# Patient Record
Sex: Female | Born: 1975 | Race: Black or African American | Hispanic: No | Marital: Single | Smoking: Former smoker
Health system: Southern US, Community
[De-identification: ages and names within clinical notes are randomized; demographics above are authoritative.]

## PROBLEM LIST (undated history)

## (undated) DIAGNOSIS — K219 Gastro-esophageal reflux disease without esophagitis: Secondary | ICD-10-CM

## (undated) DIAGNOSIS — Z889 Allergy status to unspecified drugs, medicaments and biological substances status: Secondary | ICD-10-CM

---

## 1997-09-06 ENCOUNTER — Emergency Department (HOSPITAL_COMMUNITY): Admission: EM | Admit: 1997-09-06 | Discharge: 1997-09-06 | Payer: Self-pay | Admitting: Emergency Medicine

## 2009-06-23 ENCOUNTER — Emergency Department (HOSPITAL_BASED_OUTPATIENT_CLINIC_OR_DEPARTMENT_OTHER): Admission: EM | Admit: 2009-06-23 | Discharge: 2009-06-23 | Payer: Self-pay | Admitting: Emergency Medicine

## 2009-08-17 ENCOUNTER — Emergency Department (HOSPITAL_BASED_OUTPATIENT_CLINIC_OR_DEPARTMENT_OTHER): Admission: EM | Admit: 2009-08-17 | Discharge: 2009-08-17 | Payer: Self-pay | Admitting: Emergency Medicine

## 2009-08-25 ENCOUNTER — Encounter: Admission: RE | Admit: 2009-08-25 | Discharge: 2009-11-03 | Payer: Self-pay | Admitting: Sports Medicine

## 2014-11-15 ENCOUNTER — Emergency Department (HOSPITAL_BASED_OUTPATIENT_CLINIC_OR_DEPARTMENT_OTHER)
Admission: EM | Admit: 2014-11-15 | Discharge: 2014-11-15 | Disposition: A | Discharge status: Home / Self Care | Payer: BLUE CROSS/BLUE SHIELD | Attending: Emergency Medicine | Admitting: Emergency Medicine

## 2014-11-15 ENCOUNTER — Encounter (HOSPITAL_BASED_OUTPATIENT_CLINIC_OR_DEPARTMENT_OTHER): Payer: Self-pay | Admitting: Emergency Medicine

## 2014-11-15 DIAGNOSIS — Z720 Tobacco use: Secondary | ICD-10-CM | POA: Insufficient documentation

## 2014-11-15 DIAGNOSIS — R51 Headache: Secondary | ICD-10-CM | POA: Diagnosis present

## 2014-11-15 DIAGNOSIS — Z79899 Other long term (current) drug therapy: Secondary | ICD-10-CM | POA: Insufficient documentation

## 2014-11-15 DIAGNOSIS — J302 Other seasonal allergic rhinitis: Secondary | ICD-10-CM | POA: Insufficient documentation

## 2014-11-15 DIAGNOSIS — J3089 Other allergic rhinitis: Secondary | ICD-10-CM

## 2014-11-15 HISTORY — DX: Allergy status to unspecified drugs, medicaments and biological substances: Z88.9

## 2014-11-15 MED ORDER — METHYLPREDNISOLONE 4 MG PO TBPK: ORAL_TABLET | ORAL | Status: AC

## 2014-11-15 NOTE — ED Notes (Addendum)
Patient reports seasonal allergies but states this morning "my eyes were closed shut".  Reports she took benadryl this morning and the swelling subsided but reports continued itching and watering of eyes.  Reports she was already taking claritin D for the past few days due to sinus issues.  Currently reports pain to upper cheek bones and around eye lids

## 2014-11-15 NOTE — ED Notes (Signed)
C/o itching watery eyes x 3 days  Was worse this am when she woke

## 2014-11-15 NOTE — ED Provider Notes (Signed)
CSN: [id number]     Arrival date & time 11/15/14  1952 History   First MD Initiated Contact with Patient 11/15/14 2005     Chief Complaint  Patient presents with  . Facial Pain     (Consider location/radiation/quality/duration/timing/severity/associated sxs/prior Treatment) HPI  Blood pressure 137/89, pulse 89, temperature 98.2 F (36.8 C), resp. rate 18, height 5\' 3"  (1.6 m), weight 160 lb (72.576 kg), SpO2 98 %.  Lisa Gibbs is a 39 y.o. female complaining of itching eyes, states that she woke when she woke up this morning her eyes were more swollen, she took a Benadryl and applied a cold pack with some relief however her eyes continue to itch. Patient is taking Claritin-D and states that this is not helping as much as she would like. She denies diabetes, fever, chills, purulent discharge, eyes crusting shut, pain with eye movement. She states that she feels a slight pressure along the bilateral cheekbones, she rates this is mild, no exacerbating or alleviating factors identified.  Past Medical History  Diagnosis Date  . Hx of seasonal allergies    History reviewed. No pertinent past surgical history. History reviewed. No pertinent family history. History  Substance Use Topics  . Smoking status: Current Some Day Smoker    Types: Cigarettes  . Smokeless tobacco: Not on file  . Alcohol Use: Yes     Comment: occassional   OB History    No data available     Review of Systems  10 systems reviewed and found to be negative, except as noted in the HPI.   Allergies  Review of patient's allergies indicates no known allergies.  Home Medications   Prior to Admission medications   Medication Sig Start Date End Date Taking? Authorizing Provider  loratadine-pseudoephedrine (CLARITIN-D 12-HOUR) 5-120 MG per tablet Take 1 tablet by mouth 2 (two) times daily.   Yes Historical Provider, MD  omeprazole (PRILOSEC) 20 MG capsule Take 20 mg by mouth daily.   Yes Historical Provider, MD    BP 137/89 mmHg  Pulse 89  Temp(Src) 98.2 F (36.8 C)  Resp 18  Ht 5\' 3"  (1.6 m)  Wt 160 lb (72.576 kg)  BMI 28.35 kg/m2  SpO2 98%  LMP  (LMP Unknown) Physical Exam  Constitutional: She is oriented to person, place, and time. She appears well-developed and well-nourished. No distress.  HENT:  Head: Normocephalic and atraumatic.  No erythema to bilateral eyes, no significant swelling.   No drooling or stridor. Posterior pharynx mildly erythematous no significant tonsillar hypertrophy. No exudate. Soft palate rises symmetrically. No TTP or induration under tongue.   No tenderness to palpation of frontal or bilateral maxillary sinuses.  No mucosal edema in the nares.  Bilateral tympanic membranes with normal architecture and good light reflex.  EOMI intact without pain or diplopia    Eyes: Conjunctivae and EOM are normal. Pupils are equal, round, and reactive to light.  Neck: Normal range of motion.  Cardiovascular: Normal rate, regular rhythm and intact distal pulses.   Pulmonary/Chest: Effort normal and breath sounds normal.  Abdominal: Soft. There is no tenderness.  Musculoskeletal: Normal range of motion.  Neurological: She is alert and oriented to person, place, and time.  Skin: She is not diaphoretic.  Psychiatric: She has a normal mood and affect.  Nursing note and vitals reviewed.   ED Course  Procedures (including critical care time) Labs Review Labs Reviewed - No data to display  Imaging Review No results found.   EKG Interpretation  None      MDM   Final diagnoses:  Environmental and seasonal allergies    Filed Vitals:   11/15/14 1958  BP: 137/89  Pulse: 89  Temp: 98.2 F (36.8 C)  Resp: 18  Height:  (1.6 m)  Weight: 160 lb (72.576 kg)  SpO2: 98%    Lisa Gibbs is a pleasant 39 y.o. female presenting with bilateral eye irritation and slight swelling. This is consistent with prior episodes of environmental allergies, she is taking  Claritin-D and Benadryl at home. Will add on a Medrol Dosepak. History of present illness, physical exam is not consistent with infection or periorbital cellulitis.  Evaluation does not show pathology that would require ongoing emergent intervention or inpatient treatment. Pt is hemodynamically stable and mentating appropriately. Discussed findings and plan with patient/guardian, who agrees with care plan. All questions answered. Return precautions discussed and outpatient follow up given.   New Prescriptions   METHYLPREDNISOLONE (MEDROL DOSEPAK) 4 MG TBPK TABLET    As directed by package insert    ,    Wynetta Emery, PA-C 11/15/14 2017  Glynn Octave, MD 11/15/14 2033

## 2014-11-15 NOTE — Discharge Instructions (Signed)
Please follow with your primary care doctor in the next 2 days for a check-up. They must obtain records for further management.   Do not hesitate to return to the Emergency Department for any new, worsening or concerning symptoms.    Allergies  Allergies may happen from anything your body is sensitive to. This may be food, medicines, pollens, chemicals, and many other things. Food allergies can be severe and deadly.  HOME CARE  If you do not know what causes a reaction, keep a diary. Write down the foods you ate and the symptoms that followed. Avoid foods that cause reactions.  If you have red raised spots (hives) or a rash:  Take medicine as told by your doctor.  Use medicines for red raised spots and itching as needed.  Apply cold cloths (compresses) to the skin. Take a cool bath. Avoid hot baths or showers.  If you are severely allergic:  It is often necessary to go to the hospital after you have treated your reaction.  Wear your medical alert jewelry.  You and your family must learn how to give a allergy shot or use an allergy kit (anaphylaxis kit).  Always carry your allergy kit or shot with you. Use this medicine as told by your doctor if a severe reaction is occurring. GET HELP RIGHT AWAY IF:  You have trouble breathing or are making high-pitched whistling sounds (wheezing).  You have a tight feeling in your chest or throat.  You have a puffy (swollen) mouth.  You have red raised spots, puffiness (swelling), or itching all over your body.  You have had a severe reaction that was helped by your allergy kit or shot. The reaction can return once the medicine has worn off.  You think you are having a food allergy. Symptoms most often happen within 30 minutes of eating a food.  Your symptoms have not gone away within 2 days or are getting worse.  You have new symptoms.  You want to retest yourself with a food or drink you think causes an allergic reaction. Only do this  under the care of a doctor. MAKE SURE YOU:   Understand these instructions.  Will watch your condition.  Will get help right away if you are not doing well or get worse. Document Released: 08/25/2012 Document Reviewed: 08/25/2012 [facility] Patient Information 2015 [place]. This information is not intended to replace advice given to you by your health care provider. Make sure you discuss any questions you have with your health care provider.

## 2014-11-17 ENCOUNTER — Encounter (HOSPITAL_BASED_OUTPATIENT_CLINIC_OR_DEPARTMENT_OTHER): Payer: Self-pay

## 2014-11-17 ENCOUNTER — Emergency Department (HOSPITAL_BASED_OUTPATIENT_CLINIC_OR_DEPARTMENT_OTHER)
Admission: EM | Admit: 2014-11-17 | Discharge: 2014-11-17 | Disposition: A | Discharge status: Home / Self Care | Payer: BLUE CROSS/BLUE SHIELD | Attending: Emergency Medicine | Admitting: Emergency Medicine

## 2014-11-17 ENCOUNTER — Emergency Department (HOSPITAL_BASED_OUTPATIENT_CLINIC_OR_DEPARTMENT_OTHER): Payer: BLUE CROSS/BLUE SHIELD

## 2014-11-17 DIAGNOSIS — Z79899 Other long term (current) drug therapy: Secondary | ICD-10-CM | POA: Insufficient documentation

## 2014-11-17 DIAGNOSIS — Z3202 Encounter for pregnancy test, result negative: Secondary | ICD-10-CM | POA: Insufficient documentation

## 2014-11-17 DIAGNOSIS — K219 Gastro-esophageal reflux disease without esophagitis: Secondary | ICD-10-CM | POA: Insufficient documentation

## 2014-11-17 DIAGNOSIS — R1013 Epigastric pain: Secondary | ICD-10-CM | POA: Insufficient documentation

## 2014-11-17 DIAGNOSIS — R1012 Left upper quadrant pain: Secondary | ICD-10-CM | POA: Insufficient documentation

## 2014-11-17 DIAGNOSIS — R110 Nausea: Secondary | ICD-10-CM | POA: Diagnosis not present

## 2014-11-17 DIAGNOSIS — R1010 Upper abdominal pain, unspecified: Secondary | ICD-10-CM | POA: Diagnosis present

## 2014-11-17 DIAGNOSIS — Z720 Tobacco use: Secondary | ICD-10-CM | POA: Insufficient documentation

## 2014-11-17 LAB — URINALYSIS, ROUTINE W REFLEX MICROSCOPIC
GLUCOSE, UA: NEGATIVE mg/dL
KETONES UR: 40 mg/dL — AB
LEUKOCYTES UA: NEGATIVE
Nitrite: NEGATIVE
PH: 6 (ref 5.0–8.0)
Protein, ur: NEGATIVE mg/dL
Specific Gravity, Urine: 1.027 (ref 1.005–1.030)
Urobilinogen, UA: 0.2 mg/dL (ref 0.0–1.0)

## 2014-11-17 LAB — CBC WITH DIFFERENTIAL/PLATELET
BASOS ABS: 0 10*3/uL (ref 0.0–0.1)
BASOS PCT: 0 % (ref 0–1)
Eosinophils Absolute: 0.1 10*3/uL (ref 0.0–0.7)
Eosinophils Relative: 2 % (ref 0–5)
HCT: 42.2 % (ref 36.0–46.0)
HEMOGLOBIN: 14.6 g/dL (ref 12.0–15.0)
Lymphocytes Relative: 40 % (ref 12–46)
Lymphs Abs: 3.5 10*3/uL (ref 0.7–4.0)
MCH: 32.7 pg (ref 26.0–34.0)
MCHC: 34.6 g/dL (ref 30.0–36.0)
MCV: 94.6 fL (ref 78.0–100.0)
Monocytes Absolute: 0.6 10*3/uL (ref 0.1–1.0)
Monocytes Relative: 6 % (ref 3–12)
NEUTROS ABS: 4.6 10*3/uL (ref 1.7–7.7)
NEUTROS PCT: 52 % (ref 43–77)
PLATELETS: 325 10*3/uL (ref 150–400)
RBC: 4.46 MIL/uL (ref 3.87–5.11)
RDW: 12.7 % (ref 11.5–15.5)
WBC: 8.8 10*3/uL (ref 4.0–10.5)

## 2014-11-17 LAB — COMPREHENSIVE METABOLIC PANEL
ALT: 27 U/L (ref 14–54)
AST: 23 U/L (ref 15–41)
Albumin: 4.1 g/dL (ref 3.5–5.0)
Alkaline Phosphatase: 54 U/L (ref 38–126)
Anion gap: 9 (ref 5–15)
BUN: 14 mg/dL (ref 6–20)
CALCIUM: 9.2 mg/dL (ref 8.9–10.3)
CO2: 28 mmol/L (ref 22–32)
CREATININE: 0.95 mg/dL (ref 0.44–1.00)
Chloride: 101 mmol/L (ref 101–111)
GFR calc non Af Amer: >60 mL/min (ref >60)
GLUCOSE: 87 mg/dL (ref 65–99)
Potassium: 4.1 mmol/L (ref 3.5–5.1)
Sodium: 138 mmol/L (ref 135–145)
TOTAL PROTEIN: 7.9 g/dL (ref 6.5–8.1)
Total Bilirubin: 0.9 mg/dL (ref 0.3–1.2)

## 2014-11-17 LAB — URINE MICROSCOPIC-ADD ON

## 2014-11-17 LAB — PREGNANCY, URINE: Preg Test, Ur: NEGATIVE

## 2014-11-17 LAB — LIPASE, BLOOD: LIPASE: 19 U/L — AB (ref 22–51)

## 2014-11-17 MED ORDER — GI COCKTAIL ~~LOC~~
30.0000 mL | Freq: Once | ORAL | Status: AC
  Administered 2014-11-17: 30 mL via ORAL
  Filled 2014-11-17: qty 30

## 2014-11-17 MED ORDER — FAMOTIDINE 20 MG PO TABS: 20.0000 mg | ORAL_TABLET | Freq: Two times a day (BID) | ORAL | Status: AC

## 2014-11-17 MED ORDER — SUCRALFATE 1 G PO TABS: 1.0000 g | ORAL_TABLET | Freq: Three times a day (TID) | ORAL | Status: DC

## 2014-11-17 NOTE — Discharge Instructions (Signed)
Please read and follow all provided instructions.  Your diagnoses today include:  1. Upper abdominal pain     Tests performed today include:  Blood counts and electrolytes  Blood tests to check liver and kidney function  Blood tests to check pancreas function  Urine test to look for infection and pregnancy (in women)  Ultrasound of your abdomen - shows some fatty liver but no gallstones or other problems  Vital signs. See below for your results today.   Medications prescribed:   Pepcid (famotidine) - antihistamine  You can find this medication over-the-counter.   DO NOT exceed:    Pepcid every 12 hours   Omeprazole (Prilosec) - stomach acid reducer  This medication can be found over-the-counter   Carafate - for stomach upset and to protect your stomach  Take any prescribed medications only as directed.  Home care instructions:   Follow any educational materials contained in this packet.  Follow-up instructions: Please follow-up with your primary care provider in the next 3 days for further evaluation of your symptoms.    Return instructions:  SEEK IMMEDIATE MEDICAL ATTENTION IF:  The pain does not go away or becomes severe   A temperature above 101F develops   Repeated vomiting occurs (multiple episodes)   The pain becomes localized to portions of the abdomen. The right side could possibly be appendicitis. In an adult, the left lower portion of the abdomen could be colitis or diverticulitis.   Blood is being passed in stools or vomit (bright red or black tarry stools)   You develop chest pain, difficulty breathing, dizziness or fainting, or become confused, poorly responsive, or inconsolable (young children)  If you have any other emergent concerns regarding your health  Additional Information: Abdominal (belly) pain can be caused by many things. Your caregiver performed an examination and possibly ordered blood/urine tests and imaging (CT scan,  x-rays, ultrasound). Many cases can be observed and treated at home after initial evaluation in the emergency department. Even though you are being discharged home, abdominal pain can be unpredictable. Therefore, you need a repeated exam if your pain does not resolve, returns, or worsens. Most patients with abdominal pain don't have to be admitted to the hospital or have surgery, but serious problems like appendicitis and gallbladder attacks can start out as nonspecific pain. Many abdominal conditions cannot be diagnosed in one visit, so follow-up evaluations are very important.  Your vital signs today were: BP 111/67 mmHg   Pulse 71   Temp(Src) 98.3 F (36.8 C) (Oral)   Resp 18   Ht  (1.6 m)   Wt 165 lb (74.844 kg)   BMI 29.24 kg/m2   SpO2 99%   LMP  (LMP Unknown) If your blood pressure (bp) was elevated above 135/85 this visit, please have this repeated by your doctor within one month. --------------

## 2014-11-17 NOTE — ED Provider Notes (Signed)
CSN: [id number]     Arrival date & time 11/17/14  1402 History   First MD Initiated Contact with Patient 11/17/14 1441     Chief Complaint  Patient presents with  . Abdominal Pain     (Consider location/radiation/quality/duration/timing/severity/associated sxs/prior Treatment) HPI Comments: Patient with history of GERD -- presents with complaint of upper abdominal pain which has been ongoing for 2-3 days. Patient describes nausea and upper abdominal pain, left greater than right, since that time. It is made worse with eating. Patient has been able to drink fluids. No fevers. No chest pain or cough. No diarrhea or constipation. No blood in stool. No dysuria or hematuria. Patient has been taking omeprazole without relief. Patient states that she was here on Monday and was seen for allergies. She thought that medication that she was taking for allergies was causing her stomach to be upset at that time. Symptoms did not improve or go away and have worsened. No history of gallbladder disease. No history of abdominal surgeries. No heavy NSAID use. Patient was drinking alcohol the day prior to symptoms starting which she is not a 'heavy' drinker. She states she had not had her usual 'heartburn' over the past several days.    The history is provided by the patient.    Past Medical History  Diagnosis Date  . Hx of seasonal allergies    History reviewed. No pertinent past surgical history. No family history on file. History  Substance Use Topics  . Smoking status: Current Some Day Smoker    Types: Cigarettes  . Smokeless tobacco: Not on file  . Alcohol Use: Yes     Comment: occassional   OB History    No data available     Review of Systems  Constitutional: Negative for fever.  HENT: Negative for rhinorrhea and sore throat.   Eyes: Negative for redness.  Respiratory: Negative for cough.   Cardiovascular: Negative for chest pain.  Gastrointestinal: Positive for nausea and abdominal pain.  Negative for vomiting, diarrhea, constipation and blood in stool.  Genitourinary: Negative for dysuria.  Musculoskeletal: Negative for myalgias.  Skin: Negative for rash.  Neurological: Negative for headaches.      Allergies  Review of patient's allergies indicates no known allergies.  Home Medications   Prior to Admission medications   Medication Sig Start Date End Date Taking? Authorizing Provider  loratadine-pseudoephedrine (CLARITIN-D 12-HOUR) 5-120 MG per tablet Take 1 tablet by mouth 2 (two) times daily.    Historical Provider, MD  methylPREDNISolone (MEDROL DOSEPAK) 4 MG TBPK tablet As directed by package insert 11/15/14   Joni Reining Pisciotta, PA-C  omeprazole (PRILOSEC) 20 MG capsule Take 20 mg by mouth daily.    Historical Provider, MD   BP 119/78 mmHg  Pulse 88  Temp(Src) 98.3 F (36.8 C) (Oral)  Resp 18  Ht  (1.6 m)  Wt 165 lb (74.844 kg)  BMI 29.24 kg/m2  SpO2 100%  LMP  (LMP Unknown) Physical Exam  Constitutional: She appears well-developed and well-nourished.  HENT:  Head: Normocephalic and atraumatic.  Eyes: Conjunctivae are normal. Right eye exhibits no discharge. Left eye exhibits no discharge.  Neck: Normal range of motion. Neck supple.  Cardiovascular: Normal rate, regular rhythm and normal heart sounds.   Pulmonary/Chest: Effort normal and breath sounds normal.  Abdominal: Soft. Normal appearance and bowel sounds are normal. There is tenderness in the epigastric area and left upper quadrant. There is no rebound and no guarding.  Neurological: She is alert.  Skin: Skin is warm and dry.  Psychiatric: She has a normal mood and affect.  Nursing note and vitals reviewed.   ED Course  Procedures (including critical care time) Labs Review Labs Reviewed  URINALYSIS, ROUTINE W REFLEX MICROSCOPIC (NOT AT [facility]) - Abnormal; Notable for the following:    Color, Urine AMBER (*)    APPearance CLOUDY (*)    Hgb urine dipstick TRACE (*)    Bilirubin Urine SMALL  (*)    Ketones, ur 40 (*)    All other components within normal limits  URINE MICROSCOPIC-ADD ON - Abnormal; Notable for the following:    Bacteria, UA MANY (*)    All other components within normal limits  LIPASE, BLOOD - Abnormal; Notable for the following:    Lipase 19 (*)    All other components within normal limits  PREGNANCY, URINE  CBC WITH DIFFERENTIAL/PLATELET  COMPREHENSIVE METABOLIC PANEL    Imaging Review [place] Abdomen Complete  11/17/2014   CLINICAL DATA:  Right upper quadrant/upper abdominal pain for 2-3 days  EXAM: ULTRASOUND ABDOMEN COMPLETE  COMPARISON:  None.  FINDINGS: Gallbladder: No gallstones or wall thickening visualized. No sonographic Murphy sign noted.  Common bile duct: Diameter: Normal caliber, 1 mm.  Liver: Slight increased echotexture suggesting fatty infiltration. No focal abnormality.  IVC: No abnormality visualized.  Pancreas: Visualized portion unremarkable.  Spleen: Size and appearance within normal limits.  Right Kidney: Length: 9.2 cm. Echogenicity within normal limits. No mass or hydronephrosis visualized.  Left Kidney: Length: 10.3 cm. Echogenicity within normal limits. No mass or hydronephrosis visualized. Questionable 4 mm nonobstructing stone in the lower pole.  Abdominal aorta: No aneurysm visualized.  Other findings: None.  IMPRESSION: No acute findings.  Question early/slight fatty infiltration of the liver.   Electronically Signed   By: Charlett NoseKevin  Dover M.D.   On: 11/17/2014 17:01     EKG Interpretation None       3:41 PM Patient seen and examined. Work-up initiated. Medications ordered.   Vital signs reviewed and are as follows: BP 119/78 mmHg  Pulse 88  Temp(Src) 98.3 F (36.8 C) (Oral)  Resp 18  Ht 5\' 3"  (1.6 m)  Wt 165 lb (74.844 kg)  BMI 29.24 kg/m2  SpO2 100%  LMP  (LMP Unknown)  5:24 PM Patient is feeling better. She was updated on results. Will d/c to home with Carafate and Pepcid. GI follow-up if not improved. The patient was urged  to return to the Emergency Department immediately with worsening of current symptoms, worsening abdominal pain, persistent vomiting, blood noted in stools, fever, or any other concerns. The patient verbalized understanding.    MDM   Final diagnoses:  Upper abdominal pain   Patient with symptoms consistent with gastritis. US ordered to r/o gallbladder disease and this is negative. Vitals are stable, no fever. No signs of dehydration, tolerating PO's. Lungs are clear. No focal abdominal pain, no concern for appendicitis, cholecystitis, pancreatitis, ruptured viscus, UTI, kidney stone, or any other emergent abdominal etiology. Supportive therapy indicated with return if symptoms worsen. Patient counseled.     Renne CriglerJoshua Tocara Mennen, PA-C 11/17/14 1725  Blake DivineJohn Wofford, MD 11/18/14 [phone]

## 2014-11-17 NOTE — ED Notes (Signed)
C/o abd pain, nausea since Monday-seen here that day for "allergies"

## 2014-11-17 NOTE — ED Notes (Signed)
Pt denies need to [place] kit for ED WR

## 2016-04-03 ENCOUNTER — Emergency Department (HOSPITAL_BASED_OUTPATIENT_CLINIC_OR_DEPARTMENT_OTHER): Payer: BLUE CROSS/BLUE SHIELD

## 2016-04-03 ENCOUNTER — Encounter (HOSPITAL_BASED_OUTPATIENT_CLINIC_OR_DEPARTMENT_OTHER): Payer: Self-pay | Admitting: Emergency Medicine

## 2016-04-03 ENCOUNTER — Emergency Department (HOSPITAL_BASED_OUTPATIENT_CLINIC_OR_DEPARTMENT_OTHER)
Admission: EM | Admit: 2016-04-03 | Discharge: 2016-04-03 | Disposition: A | Discharge status: Home / Self Care | Payer: BLUE CROSS/BLUE SHIELD | Attending: Emergency Medicine | Admitting: Emergency Medicine

## 2016-04-03 DIAGNOSIS — Z79899 Other long term (current) drug therapy: Secondary | ICD-10-CM | POA: Insufficient documentation

## 2016-04-03 DIAGNOSIS — F17290 Nicotine dependence, other tobacco product, uncomplicated: Secondary | ICD-10-CM | POA: Diagnosis not present

## 2016-04-03 DIAGNOSIS — R1011 Right upper quadrant pain: Secondary | ICD-10-CM | POA: Diagnosis present

## 2016-04-03 DIAGNOSIS — K2900 Acute gastritis without bleeding: Secondary | ICD-10-CM | POA: Diagnosis not present

## 2016-04-03 LAB — CBC WITH DIFFERENTIAL/PLATELET
Basophils Absolute: 0 10*3/uL (ref 0.0–0.1)
Basophils Relative: 0 %
Eosinophils Absolute: 0.4 10*3/uL (ref 0.0–0.7)
Eosinophils Relative: 4 %
HCT: 39.2 % (ref 36.0–46.0)
Hemoglobin: 13.6 g/dL (ref 12.0–15.0)
Lymphocytes Relative: 46 %
Lymphs Abs: 3.8 10*3/uL (ref 0.7–4.0)
MCH: 32.9 pg (ref 26.0–34.0)
MCHC: 34.7 g/dL (ref 30.0–36.0)
MCV: 94.9 fL (ref 78.0–100.0)
Monocytes Absolute: 0.6 10*3/uL (ref 0.1–1.0)
Monocytes Relative: 7 %
Neutro Abs: 3.5 10*3/uL (ref 1.7–7.7)
Neutrophils Relative %: 43 %
Platelets: 305 10*3/uL (ref 150–400)
RBC: 4.13 MIL/uL (ref 3.87–5.11)
RDW: 12.6 % (ref 11.5–15.5)
WBC: 8.3 10*3/uL (ref 4.0–10.5)

## 2016-04-03 LAB — COMPREHENSIVE METABOLIC PANEL
ALT: 36 U/L (ref 14–54)
AST: 30 U/L (ref 15–41)
Albumin: 4.2 g/dL (ref 3.5–5.0)
Alkaline Phosphatase: 51 U/L (ref 38–126)
Anion gap: 8 (ref 5–15)
BUN: 18 mg/dL (ref 6–20)
CO2: 25 mmol/L (ref 22–32)
Calcium: 8.8 mg/dL — ABNORMAL LOW (ref 8.9–10.3)
Chloride: 103 mmol/L (ref 101–111)
Creatinine, Ser: 0.84 mg/dL (ref 0.44–1.00)
GFR calc Af Amer: >60 mL/min (ref >60)
GFR calc non Af Amer: >60 mL/min (ref >60)
Glucose, Bld: 83 mg/dL (ref 65–99)
Potassium: 3.6 mmol/L (ref 3.5–5.1)
Sodium: 136 mmol/L (ref 135–145)
Total Bilirubin: 0.6 mg/dL (ref 0.3–1.2)
Total Protein: 7.4 g/dL (ref 6.5–8.1)

## 2016-04-03 LAB — URINALYSIS, ROUTINE W REFLEX MICROSCOPIC
Bilirubin Urine: NEGATIVE
GLUCOSE, UA: NEGATIVE mg/dL
Hgb urine dipstick: NEGATIVE
Ketones, ur: NEGATIVE mg/dL
Nitrite: NEGATIVE
PH: 6 (ref 5.0–8.0)
Protein, ur: NEGATIVE mg/dL
SPECIFIC GRAVITY, URINE: 1.025 (ref 1.005–1.030)

## 2016-04-03 LAB — LIPASE, BLOOD: Lipase: 25 U/L (ref 11–51)

## 2016-04-03 LAB — URINE MICROSCOPIC-ADD ON

## 2016-04-03 LAB — PREGNANCY, URINE: Preg Test, Ur: NEGATIVE

## 2016-04-03 MED ORDER — SUCRALFATE 1 G PO TABS: 1.0000 g | ORAL_TABLET | Freq: Three times a day (TID) | ORAL | 0 refills | Status: AC

## 2016-04-03 MED ORDER — TRAMADOL HCL 50 MG PO TABS
50.0000 mg | ORAL_TABLET | Freq: Four times a day (QID) | ORAL | 0 refills | Status: AC | PRN
Start: 2016-04-03 — End: ?

## 2016-04-03 NOTE — ED Provider Notes (Signed)
[facility] Provider Note   CSN: [id number] Arrival date & time: 04/03/16  1540  By signing my name below, I, [name], attest that this documentation has been prepared under the direction and in the presence of BoeingChris Haruki Arnold, PA-C. Electronically Signed: Angelene GiovanniEmmanuella Mensah, ED Scribe. 04/03/16. 5:40 PM.   History   Chief Complaint Chief Complaint  Patient presents with  . Abdominal Pain    x4 days, RUQ radiates to her back    HPI Comments: Lisa Gibbs is a 40 y.o. female with a hx of GERD who presents to the Emergency Department complaining of gradually worsening moderate RUQ pain that radiates up through her epigastrium onset 4 days ago. She notes that the pain is worse 15 minutes after eating. She reports associated nausea and multiple episodes of non-bloody diarrhea. She adds that she has noticed blood in stool 4 times in 8 months but have not been evaluated by her PCP; last episode was 3 days ago. No alleviating factors noted. Pt has not tried any medications for these symptoms. She has an allergy to Nsaids. She denies any similar past symptoms, stating that these symptoms are not consistent with her GERD symptoms. She denies a hx of any abdominal surgeries. She also denies any fever, chills, vomiting, vaginal bleeding, vaginal discharge, or any other symptoms.   The history is provided by the patient. No language interpreter was used.    Past Medical History:  Diagnosis Date  . Hx of seasonal allergies     There are no active problems to display for this patient.   History reviewed. No pertinent surgical history.  OB History    No data available       Home Medications    Prior to Admission medications   Medication Sig Start Date End Date Taking? Authorizing Provider  esomeprazole (NEXIUM) 20 MG capsule Take 20 mg by mouth daily.   Yes Historical Provider, MD  loratadine-pseudoephedrine (CLARITIN-D 12-HOUR) 5-120 MG per tablet Take 1 tablet by  mouth 2 (two) times daily.   Yes Historical Provider, MD  UNKNOWN TO PATIENT    Yes Historical Provider, MD  famotidine (PEPCID) 20 MG tablet Take 1 tablet (20 mg total) by mouth 2 (two) times daily. 11/17/14   Renne CriglerJoshua Geiple, PA-C  methylPREDNISolone (MEDROL DOSEPAK) 4 MG TBPK tablet As directed by package insert 11/15/14   Joni ReiningNicole Pisciotta, PA-C  omeprazole (PRILOSEC) 20 MG capsule Take 20 mg by mouth daily.    Historical Provider, MD  sucralfate (CARAFATE) 1 G tablet Take 1 tablet (1 g total) by mouth 4 (four) times daily -  with meals and at bedtime. 11/17/14   Renne CriglerJoshua Geiple, PA-C    Family History History reviewed. No pertinent family history.  Social History Social History  Substance Use Topics  . Smoking status: Current Some Day Smoker    Packs/day: 0.00    Types: Cigars  . Smokeless tobacco: Never Used  . Alcohol use Yes     Comment: occassional     Allergies   Nsaids   Review of Systems Review of Systems  A complete 10 system review of systems was obtained and all systems are negative except as noted in the HPI and PMH.   Physical Exam Updated Vital Signs BP 115/76 (BP Location: Left Arm)   Pulse 77   Temp 98.4 F (36.9 C) (Oral)   Resp 20   Ht 5\' 3"  (1.6 m)   Wt 165 lb (74.8 kg)   SpO2 100%  BMI 29.23 kg/m   Physical Exam  Constitutional: She is oriented to person, place, and time. She appears well-developed and well-nourished.  HENT:  Head: Normocephalic and atraumatic.  Eyes: Pupils are equal, round, and reactive to light.  Neck: Normal range of motion. Neck supple.  Cardiovascular: Normal rate and regular rhythm.   Pulmonary/Chest: Effort normal and breath sounds normal. No respiratory distress.  Abdominal: Soft. There is tenderness. There is no rebound and no guarding.  RUQ pain on exam  Neurological: She is alert and oriented to person, place, and time.  Skin: Skin is warm and dry.  Psychiatric: She has a normal mood and affect.  Nursing note and  vitals reviewed.    ED Treatments / Results  DIAGNOSTIC STUDIES: Oxygen Saturation is 100% on RA, normal by my interpretation.    COORDINATION OF CARE: 5:28 PM- Pt advised of plan for treatment and pt agrees. Pt will receive lab work for further evaluation.    Labs (all labs ordered are listed, but only abnormal results are displayed) Labs Reviewed  URINALYSIS, ROUTINE W REFLEX MICROSCOPIC (NOT AT [facility]) - Abnormal; Notable for the following:       Result Value   APPearance CLOUDY (*)    Leukocytes, UA TRACE (*)    All other components within normal limits  URINE MICROSCOPIC-ADD ON - Abnormal; Notable for the following:    Squamous Epithelial / LPF 6-30 (*)    Bacteria, UA FEW (*)    All other components within normal limits  PREGNANCY, URINE  COMPREHENSIVE METABOLIC PANEL  CBC WITH DIFFERENTIAL/PLATELET  LIPASE, BLOOD    EKG  EKG Interpretation None       Radiology No results found.  Procedures Procedures (including critical care time)  Medications Ordered in ED Medications - No data to display   Initial Impression / Assessment and Plan / ED Course  [name], PA-C has reviewed the triage vital signs and the nursing notes.  Pertinent labs & imaging results that were available during my care of the patient were reviewed by me and considered in my medical decision making (see chart for details).  Clinical Course      Patient is nontoxic, nonseptic appearing, in no apparent distress.  Patient's pain and other symptoms adequately managed in emergency department.  Labs, imaging and vitals reviewed.  Patient does not meet the SIRS or Sepsis criteria.  On repeat exam patient does not have a surgical abdomin and there are no peritoneal signs.  No indication of appendicitis, bowel obstruction, bowel perforation, cholecystitis, diverticulitis, PID or ectopic pregnancy.  Patient discharged home with symptomatic treatment and given strict instructions for follow-up with  their primary care physician.  I have also discussed reasons to return immediately to the ER.  Patient expresses understanding and agrees with plan.   Final Clinical Impressions(s) / ED Diagnoses   Final diagnoses:  None    New Prescriptions New Prescriptions   No medications on file   I personally performed the services described in this documentation, which was scribed in my presence. The recorded information has been reviewed and is accurate.   Charlestine NightChristopher Oneda Duffett, PA-C 04/05/16 0057    Maia PlanJoshua G Long, MD 04/05/16 1000

## 2016-04-03 NOTE — ED Triage Notes (Signed)
Patient with RUQ abdominal pain. Patient has had the pain x1 week. Patient states the pain is radiating to her right back, states the pain also feels like a squeezing circular motion. Patient reports the pain is worse when she eats. Patient with nausea and the sensation of acid reflux, without the pain, states she is on Nexium. Patient also reports diarrhea that resolves itself after a few episodes. Patient tried pepto over the weekend without relief. Patient also reports blood in her stool on Saturday.

## 2016-04-03 NOTE — Discharge Instructions (Signed)
Follow-up with the GI Dr. provided.  Your testing here today did not show any significant abnormality.  Return here as needed

## 2017-06-11 DIAGNOSIS — Z1231 Encounter for screening mammogram for malignant neoplasm of breast: Secondary | ICD-10-CM | POA: Diagnosis not present

## 2017-06-11 DIAGNOSIS — Z0000 Encounter for general adult medical examination without abnormal findings: Secondary | ICD-10-CM | POA: Diagnosis not present

## 2017-06-11 DIAGNOSIS — J302 Other seasonal allergic rhinitis: Secondary | ICD-10-CM | POA: Insufficient documentation

## 2017-06-11 DIAGNOSIS — K219 Gastro-esophageal reflux disease without esophagitis: Secondary | ICD-10-CM | POA: Insufficient documentation

## 2017-06-11 DIAGNOSIS — Z6831 Body mass index (BMI) 31.0-31.9, adult: Secondary | ICD-10-CM | POA: Diagnosis not present

## 2017-06-11 DIAGNOSIS — Z7689 Persons encountering health services in other specified circumstances: Secondary | ICD-10-CM | POA: Diagnosis not present

## 2017-06-14 DIAGNOSIS — Z0000 Encounter for general adult medical examination without abnormal findings: Secondary | ICD-10-CM | POA: Diagnosis not present

## 2017-06-19 DIAGNOSIS — M67479 Ganglion, unspecified ankle and foot: Secondary | ICD-10-CM | POA: Diagnosis not present

## 2017-06-19 DIAGNOSIS — R2241 Localized swelling, mass and lump, right lower limb: Secondary | ICD-10-CM | POA: Diagnosis not present

## 2017-06-19 DIAGNOSIS — Z1231 Encounter for screening mammogram for malignant neoplasm of breast: Secondary | ICD-10-CM | POA: Diagnosis not present

## 2017-07-04 DIAGNOSIS — M799 Soft tissue disorder, unspecified: Secondary | ICD-10-CM | POA: Diagnosis not present

## 2017-07-04 DIAGNOSIS — M67471 Ganglion, right ankle and foot: Secondary | ICD-10-CM | POA: Insufficient documentation

## 2017-07-05 DIAGNOSIS — M62830 Muscle spasm of back: Secondary | ICD-10-CM | POA: Diagnosis not present

## 2017-07-12 ENCOUNTER — Other Ambulatory Visit: Payer: Self-pay

## 2017-07-12 ENCOUNTER — Encounter (HOSPITAL_BASED_OUTPATIENT_CLINIC_OR_DEPARTMENT_OTHER): Payer: Self-pay | Admitting: Emergency Medicine

## 2017-07-12 ENCOUNTER — Emergency Department (HOSPITAL_BASED_OUTPATIENT_CLINIC_OR_DEPARTMENT_OTHER): Payer: BLUE CROSS/BLUE SHIELD

## 2017-07-12 ENCOUNTER — Emergency Department (HOSPITAL_BASED_OUTPATIENT_CLINIC_OR_DEPARTMENT_OTHER)
Admission: EM | Admit: 2017-07-12 | Discharge: 2017-07-12 | Disposition: A | Discharge status: Home / Self Care | Payer: BLUE CROSS/BLUE SHIELD | Attending: Emergency Medicine | Admitting: Emergency Medicine

## 2017-07-12 DIAGNOSIS — M545 Low back pain, unspecified: Secondary | ICD-10-CM

## 2017-07-12 DIAGNOSIS — Z3202 Encounter for pregnancy test, result negative: Secondary | ICD-10-CM | POA: Diagnosis not present

## 2017-07-12 DIAGNOSIS — Z79899 Other long term (current) drug therapy: Secondary | ICD-10-CM | POA: Insufficient documentation

## 2017-07-12 DIAGNOSIS — Z87891 Personal history of nicotine dependence: Secondary | ICD-10-CM | POA: Diagnosis not present

## 2017-07-12 HISTORY — DX: Gastro-esophageal reflux disease without esophagitis: K21.9

## 2017-07-12 LAB — URINALYSIS, ROUTINE W REFLEX MICROSCOPIC
Bilirubin Urine: NEGATIVE
Glucose, UA: NEGATIVE mg/dL
Ketones, ur: NEGATIVE mg/dL
Leukocytes, UA: NEGATIVE
Nitrite: NEGATIVE
PROTEIN: NEGATIVE mg/dL
Specific Gravity, Urine: 1.015 (ref 1.005–1.030)
pH: 7 (ref 5.0–8.0)

## 2017-07-12 LAB — CBC WITH DIFFERENTIAL/PLATELET
Basophils Absolute: 0 10*3/uL (ref 0.0–0.1)
Basophils Relative: 0 %
EOS ABS: 0.2 10*3/uL (ref 0.0–0.7)
EOS PCT: 2 %
HCT: 35.9 % — ABNORMAL LOW (ref 36.0–46.0)
Hemoglobin: 12.7 g/dL (ref 12.0–15.0)
LYMPHS ABS: 2.3 10*3/uL (ref 0.7–4.0)
LYMPHS PCT: 18 %
MCH: 32.7 pg (ref 26.0–34.0)
MCHC: 35.4 g/dL (ref 30.0–36.0)
MCV: 92.5 fL (ref 78.0–100.0)
MONO ABS: 0.9 10*3/uL (ref 0.1–1.0)
Monocytes Relative: 7 %
Neutro Abs: 9.5 10*3/uL — ABNORMAL HIGH (ref 1.7–7.7)
Neutrophils Relative %: 73 %
PLATELETS: 356 10*3/uL (ref 150–400)
RBC: 3.88 MIL/uL (ref 3.87–5.11)
RDW: 12.5 % (ref 11.5–15.5)
WBC: 12.9 10*3/uL — ABNORMAL HIGH (ref 4.0–10.5)

## 2017-07-12 LAB — PREGNANCY, URINE: Preg Test, Ur: NEGATIVE

## 2017-07-12 LAB — URINALYSIS, MICROSCOPIC (REFLEX)

## 2017-07-12 LAB — BASIC METABOLIC PANEL
Anion gap: 7 (ref 5–15)
BUN: 16 mg/dL (ref 6–20)
CO2: 24 mmol/L (ref 22–32)
Calcium: 8.2 mg/dL — ABNORMAL LOW (ref 8.9–10.3)
Chloride: 104 mmol/L (ref 101–111)
Creatinine, Ser: 0.88 mg/dL (ref 0.44–1.00)
GFR calc Af Amer: >60 mL/min (ref >60)
Glucose, Bld: 88 mg/dL (ref 65–99)
POTASSIUM: 3.4 mmol/L — AB (ref 3.5–5.1)
Sodium: 135 mmol/L (ref 135–145)

## 2017-07-12 MED ORDER — HYDROCODONE-ACETAMINOPHEN 5-325 MG PO TABS: 1.0000 | ORAL_TABLET | Freq: Four times a day (QID) | ORAL | 0 refills | Status: AC | PRN

## 2017-07-12 MED ORDER — KETOROLAC TROMETHAMINE 30 MG/ML IJ SOLN
30.0000 mg | Freq: Once | INTRAMUSCULAR | Status: AC
  Administered 2017-07-12: 30 mg via INTRAVENOUS
  Filled 2017-07-12: qty 1

## 2017-07-12 MED ORDER — PREDNISONE 20 MG PO TABS: ORAL_TABLET | ORAL | 0 refills | Status: AC

## 2017-07-12 MED ORDER — LIDOCAINE 5 % EX PTCH
1.0000 | MEDICATED_PATCH | CUTANEOUS | Status: DC
  Filled 2017-07-12: qty 1

## 2017-07-12 MED ORDER — DICLOFENAC SODIUM 1 % TD GEL: 4.0000 g | Freq: Four times a day (QID) | TRANSDERMAL | 0 refills | Status: AC

## 2017-07-12 MED ORDER — SODIUM CHLORIDE 0.9 % IV BOLUS (SEPSIS)
1000.0000 mL | Freq: Once | INTRAVENOUS | Status: AC
  Administered 2017-07-12: 1000 mL via INTRAVENOUS

## 2017-07-12 MED ORDER — CYCLOBENZAPRINE HCL 5 MG PO TABS: 5.0000 mg | ORAL_TABLET | Freq: Three times a day (TID) | ORAL | 0 refills | Status: AC | PRN

## 2017-07-12 MED ORDER — MORPHINE SULFATE (PF) 4 MG/ML IV SOLN
4.0000 mg | Freq: Once | INTRAVENOUS | Status: AC
  Administered 2017-07-12: 4 mg via INTRAVENOUS
  Filled 2017-07-12: qty 1

## 2017-07-12 MED FILL — HYDROCODON-APAP 5-325: 5-325 | 2 days supply | Qty: 8 | Fill #0

## 2017-07-12 MED FILL — CYCLOBENZAPRINE 5 MG TABLET: 5 | 3 days supply | Qty: 10 | Fill #0

## 2017-07-12 MED FILL — DICLOFENAC SODIUM 1% GEL: 1 | 7 days supply | Qty: 100 | Fill #0

## 2017-07-12 MED FILL — predniSONE 20 MG TABS: 20 | 9 days supply | Qty: 18 | Fill #0

## 2017-07-12 NOTE — ED Notes (Signed)
Unable to obtain blood with IV access; will try again when pt returns from XR

## 2017-07-12 NOTE — Discharge Instructions (Signed)
Take prednisone as prescribed.   Take motrin for pain.   Take flexeril for muscle spasms.   Take vicodin for severe pain.   See either Dr. Pearletha ForgeHudnall or Dr. Venetia MaxonStern for follow up. Consider cortisone injections for pain. Consider MRI if you still have persistent pain. You might have a slipped disc   Return to ER if you have worse back pain, numbness, weakness, trouble urinating.

## 2017-07-12 NOTE — ED Provider Notes (Signed)
[facility] EMERGENCY DEPARTMENT Provider Note   CSN: [id number] Arrival date & time: 07/12/17  1157     History   Chief Complaint Chief Complaint  Patient presents with  . Back Pain    HPI Lisa Gibbs is a 42 y.o. female history of reflux here presenting with back pain, back spasms.  Patient states that she woke about a week ago with severe back spasms.  Patient states that she went to urgent care and was prescribed a course of Medrol Dosepak, Flexeril and the pain improved but woke up this morning and the pain came back suddenly.  Patient denies fall or injury to the back.  Patient states that the pain does not radiate down her legs just in the back area.  Patient denies any abdominal pain or leg numbness or weakness or trouble urinating.  Denies any previous back surgeries.  The history is provided by the patient.    Past Medical History:  Diagnosis Date  . GERD (gastroesophageal reflux disease)   . Hx of seasonal allergies     There are no active problems to display for this patient.   History reviewed. No pertinent surgical history.  OB History    No data available       Home Medications    Prior to Admission medications   Medication Sig Start Date End Date Taking? Authorizing Provider  esomeprazole (NEXIUM) 20 MG capsule Take 20 mg by mouth daily.    [provider]  famotidine (PEPCID) 20 MG tablet Take 1 tablet (20 mg total) by mouth 2 (two) times daily. 11/17/14   Renne Crigler, PA-C  loratadine-pseudoephedrine (CLARITIN-D 12-HOUR) 5-120 MG per tablet Take 1 tablet by mouth 2 (two) times daily.    [provider]  methylPREDNISolone (MEDROL DOSEPAK) 4 MG TBPK tablet As directed by package insert 11/15/14   Pisciotta, Joni Reining, PA-C  omeprazole (PRILOSEC) 20 MG capsule Take 20 mg by mouth daily.    [provider]  sucralfate (CARAFATE) 1 g tablet Take 1 tablet (1 g total) by mouth 4 (four) times daily -  with meals and at  bedtime. 04/03/16   Lawyer, Cristal Deer, PA-C  traMADol (ULTRAM) 50 MG tablet Take 1 tablet (50 mg total) by mouth every 6 (six) hours as needed for severe pain. 04/03/16   Lawyer, Cristal Deer, PA-C  UNKNOWN TO PATIENT     [provider]    Family History No family history on file.  Social History Social History   Tobacco Use  . Smoking status: Former Smoker    Packs/day: 0.00    Types: Cigars  . Smokeless tobacco: Never Used  Substance Use Topics  . Alcohol use: Yes    Comment: occassional  . Drug use: No     Allergies   Patient has no active allergies.   Review of Systems Review of Systems  Musculoskeletal: Positive for back pain.  All other systems reviewed and are negative.    Physical Exam Updated Vital Signs BP 106/63 (BP Location: Left Arm)   Pulse 93   Temp 98.9 F (37.2 C) (Oral)   Resp 18   Ht 5\' 3"  (1.6 m)   Wt 86.2 kg (190 lb)   SpO2 100%   BMI 33.66 kg/m   Physical Exam  Constitutional: She is oriented to person, place, and time.  Slightly uncomfortable   HENT:  Head: Normocephalic.  Mouth/Throat: Oropharynx is clear and moist.  Eyes: Conjunctivae and EOM are normal. Pupils are equal, round,  and reactive to light.  Neck: Normal range of motion. Neck supple.  Cardiovascular: Normal rate, regular rhythm and normal heart sounds.  Pulmonary/Chest: Effort normal and breath sounds normal. No stridor. No respiratory distress. She has no wheezes.  Abdominal: Soft. Bowel sounds are normal. She exhibits no distension. There is no guarding.  No pulsatile mass   Musculoskeletal: Normal range of motion.  Lower lumbar tenderness with spasms. No obvious stepoff. Nl ROM of the hips. No saddle anesthesia   Neurological: She is alert and oriented to person, place, and time.  No saddle anesthesia. Able to ambulate. Nl strength throughout, nl reflexes   Skin: Skin is warm.  Psychiatric: She has a normal mood and affect.  Nursing note and vitals  reviewed.    ED Treatments / Results  Labs (all labs ordered are listed, but only abnormal results are displayed) Labs Reviewed  CBC WITH DIFFERENTIAL/PLATELET - Abnormal; Notable for the following components:      Result Value   WBC 12.9 (*)    HCT 35.9 (*)    Neutro Abs 9.5 (*)    All other components within normal limits  BASIC METABOLIC PANEL - Abnormal; Notable for the following components:   Potassium 3.4 (*)    Calcium 8.2 (*)    All other components within normal limits  URINALYSIS, ROUTINE W REFLEX MICROSCOPIC - Abnormal; Notable for the following components:   Hgb urine dipstick SMALL (*)    All other components within normal limits  URINALYSIS, MICROSCOPIC (REFLEX) - Abnormal; Notable for the following components:   Bacteria, UA MANY (*)    Squamous Epithelial / LPF 0-5 (*)    All other components within normal limits  PREGNANCY, URINE    EKG  EKG Interpretation None       Radiology Dg Lumbar Spine Complete  Result Date: 07/12/2017 CLINICAL DATA:  Back pain EXAM: LUMBAR SPINE - COMPLETE 4+ VIEW COMPARISON:  None. FINDINGS: There are 5 nonrib bearing lumbar-type vertebral bodies. The vertebral body heights are maintained. Mild dextrocurvature of the lumbar spine. There is no static listhesis. There is no spondylolysis. There is no acute fracture. Degenerative disc disease with disc height loss at L4-5 and L5-S1. The SI joints are unremarkable. IMPRESSION: No acute osseous injury of the lumbar spine. Electronically Signed   By: Elige Ko   On: 07/12/2017 14:59    Procedures Procedures (including critical care time)  Medications Ordered in ED Medications  ketorolac (TORADOL) 30 MG/ML injection 30 mg (30 mg Intravenous Given 07/12/17 1437)  sodium chloride 0.9 % bolus 1,000 mL (1,000 mLs Intravenous New Bag/Given 07/12/17 1436)  morphine 4 MG/ML injection 4 mg (4 mg Intravenous Given 07/12/17 1437)     Initial Impression / Assessment and Plan / ED Course  I  have reviewed the triage vital signs and the nursing notes.  Pertinent labs & imaging results that were available during my care of the patient were reviewed by me and considered in my medical decision making (see chart for details).     Lisa Gibbs is a 42 y.o. female here with back pain. Neurovascular intact. Likely radiculopathy vs muscle spasms. Will get xrays. Will give pain meds.   3:54 PM Labs unremarkable. UA unremarkable. Xray showed no obvious fractures. I think likely radiculopathy. Pain improved now. Will dc home with steroids, flexeril, vicodin prn. Will refer to ortho outpatient.   Final Clinical Impressions(s) / ED Diagnoses   Final diagnoses:  None    ED Discharge Orders  None       Charlynne PanderYao, David Hsienta, MD 07/12/17 1556

## 2017-07-12 NOTE — ED Notes (Signed)
Patient transported to X-ray 

## 2017-07-12 NOTE — ED Triage Notes (Signed)
Pt c/o lower back pain since last Wed w/o injury; was seen at [facility] last Fri; rxs (steroid, muscle relaxer) not helping; reports difficulty amb, drove self

## 2017-07-18 ENCOUNTER — Encounter: Payer: Self-pay | Admitting: Family Medicine

## 2017-07-18 ENCOUNTER — Ambulatory Visit (INDEPENDENT_AMBULATORY_CARE_PROVIDER_SITE_OTHER): Payer: BLUE CROSS/BLUE SHIELD | Admitting: Family Medicine

## 2017-07-18 DIAGNOSIS — M545 Low back pain, unspecified: Secondary | ICD-10-CM

## 2017-07-18 NOTE — Patient Instructions (Signed)
You have a lumbar strain. Ok to take tylenol for baseline pain relief (1-2 extra strength tabs 3x/day) Finish the medrol dose pak. Day after you finish the medrol start meloxicam 15mg  daily with food for pain and inflammation. Ok to take the flexeril for muscle spasms as needed too. Stay as active as possible. Do home exercises and stretches as directed every day. Consider massage, chiropractor, physical therapy, and/or acupuncture. Physical therapy has been shown to be helpful while the others have mixed results. Strengthening of low back muscles, abdominal musculature are key for long term pain relief. If not improving, will consider further imaging (MRI). Follow up with me in 1 month or as needed if you're doing well.

## 2017-07-19 ENCOUNTER — Encounter: Payer: Self-pay | Admitting: Family Medicine

## 2017-07-19 DIAGNOSIS — M545 Low back pain, unspecified: Secondary | ICD-10-CM | POA: Insufficient documentation

## 2017-07-19 NOTE — Progress Notes (Signed)
PCP: Herma Carson, MD  Subjective:   HPI: Patient is a 42 y.o. female here for back pain.  Patient reports she woke up with low back pain about 2 weeks ago Friday. No acute injury or trauma. No increase in activity level the day prior. Pain bilateral low back into right side and up the back. She improved following medrol dose pack though felt very mild tenderness in low back. No radiation into legs. No bowel/bladder dysfunction. No numbness. Pain recurred again when she woke up one morning and is now on a second prednisone dose pack with flexeril and tramadol as needed. Pain was sharp, worse with any motions.  Past Medical History:  Diagnosis Date  . GERD (gastroesophageal reflux disease)   . Hx of seasonal allergies     Current Outpatient Medications on File Prior to Visit  Medication Sig Dispense Refill  . cyclobenzaprine (FLEXERIL) 5 MG tablet Take 1 tablet (5 mg total) by mouth 3 (three) times daily as needed for muscle spasms. 10 tablet 0  . diclofenac sodium (VOLTAREN) 1 % GEL Apply 4 g topically 4 (four) times daily. 100 g 0  . esomeprazole (NEXIUM) 20 MG capsule Take 20 mg by mouth daily.    . famotidine (PEPCID) 20 MG tablet Take 1 tablet (20 mg total) by mouth 2 (two) times daily. 30 tablet 0  . HYDROcodone-acetaminophen (NORCO/VICODIN) 5-325 MG tablet Take 1 tablet by mouth every 6 (six) hours as needed. 8 tablet 0  . loratadine-pseudoephedrine (CLARITIN-D 12-HOUR) 5-120 MG per tablet Take 1 tablet by mouth 2 (two) times daily.    . methylPREDNISolone (MEDROL DOSEPAK) 4 MG TBPK tablet As directed by package insert 21 tablet 0  . omeprazole (PRILOSEC) 20 MG capsule Take 20 mg by mouth daily.    . predniSONE (DELTASONE) 20 MG tablet Take 60 mg daily x 3 days then 40 mg daily x 3 days then 20 mg daily x 3 days 18 tablet 0  . sucralfate (CARAFATE) 1 g tablet Take 1 tablet (1 g total) by mouth 4 (four) times daily -  with meals and at bedtime. 30 tablet 0  . traMADol  (ULTRAM) 50 MG tablet Take 1 tablet (50 mg total) by mouth every 6 (six) hours as needed for severe pain. 15 tablet 0  . UNKNOWN TO PATIENT      No current facility-administered medications on file prior to visit.     History reviewed. No pertinent surgical history.  Allergies  Allergen Reactions  . Aspirin Other (See Comments)  . Tolmetin Other (See Comments)    "upsets stomach"    Social History   Socioeconomic History  . Marital status: Single    Spouse name: Not on file  . Number of children: Not on file  . Years of education: Not on file  . Highest education level: Not on file  Social Needs  . Financial resource strain: Not on file  . Food insecurity - worry: Not on file  . Food insecurity - inability: Not on file  . Transportation needs - medical: Not on file  . Transportation needs - non-medical: Not on file  Occupational History  . Not on file  Tobacco Use  . Smoking status: Former Smoker    Packs/day: 0.00    Types: Cigars  . Smokeless tobacco: Never Used  Substance and Sexual Activity  . Alcohol use: Yes    Comment: occassional  . Drug use: No  . Sexual activity: Yes    Birth control/protection:  IUD  Other Topics Concern  . Not on file  Social History Narrative  . Not on file    History reviewed. No pertinent family history.  BP 134/84   Ht 5\' 4"  (1.626 m)   Wt 190 lb (86.2 kg)   BMI 32.61 kg/m   Review of Systems: See HPI above.     Objective:  Physical Exam:  Gen: NAD, comfortable in exam room  Back: No gross deformity, scoliosis. TTP mild right > left lumbar paraspinal regions.  No midline or bony TTP. FROM. Strength LEs 5/5 all muscle groups.   2+ MSRs in patellar and achilles tendons, equal bilaterally. Negative SLRs. Sensation intact to light touch bilaterally.  Bilateral hips: No deformity. No tenderness. FROM with 5/5 strength. NVI distally.   Assessment & Plan:  1. Low back pain - consistent with severe lumbar strain.   Reviewed home exercise program to do daily.  Finish medrol dose pack then start meloxicam.  Flexeril if needed for spasms.  Heat if needed.  F/u in 1 month or prn if doing well.

## 2017-07-19 NOTE — Assessment & Plan Note (Signed)
consistent with severe lumbar strain.  Reviewed home exercise program to do daily.  Finish medrol dose pack then start meloxicam.  Flexeril if needed for spasms.  Heat if needed.  F/u in 1 month or prn if doing well.

## 2017-07-31 ENCOUNTER — Telehealth: Payer: Self-pay | Admitting: Family Medicine

## 2017-07-31 MED ORDER — MELOXICAM 15 MG PO TABS: 15.0000 mg | ORAL_TABLET | Freq: Every day | ORAL | 1 refills | Status: AC

## 2017-07-31 NOTE — Telephone Encounter (Signed)
Patient was informed.

## 2017-07-31 NOTE — Telephone Encounter (Signed)
I sent it in for her.  Thanks

## 2017-07-31 NOTE — Telephone Encounter (Signed)
Patient states her pharmacy never received the Meloxicam Rx that was prescribed at her last OV  Preferred pharmacy: [facility] [address] [facility] [place]

## 2017-08-19 ENCOUNTER — Ambulatory Visit: Payer: BLUE CROSS/BLUE SHIELD | Admitting: Family Medicine

## 2018-09-08 DIAGNOSIS — N750 Cyst of Bartholin's gland: Secondary | ICD-10-CM | POA: Diagnosis not present

## 2018-09-08 DIAGNOSIS — B373 Candidiasis of vulva and vagina: Secondary | ICD-10-CM | POA: Diagnosis not present

## 2018-09-08 DIAGNOSIS — K5900 Constipation, unspecified: Secondary | ICD-10-CM | POA: Diagnosis not present

## 2018-11-16 IMAGING — DX DG LUMBAR SPINE COMPLETE 4+V
5 series · 5 of 5 positions shown · non-contrast
Comparison: None.

CLINICAL DATA: Back pain

EXAM:
LUMBAR SPINE - COMPLETE 4+ VIEW

[l-spine ap]
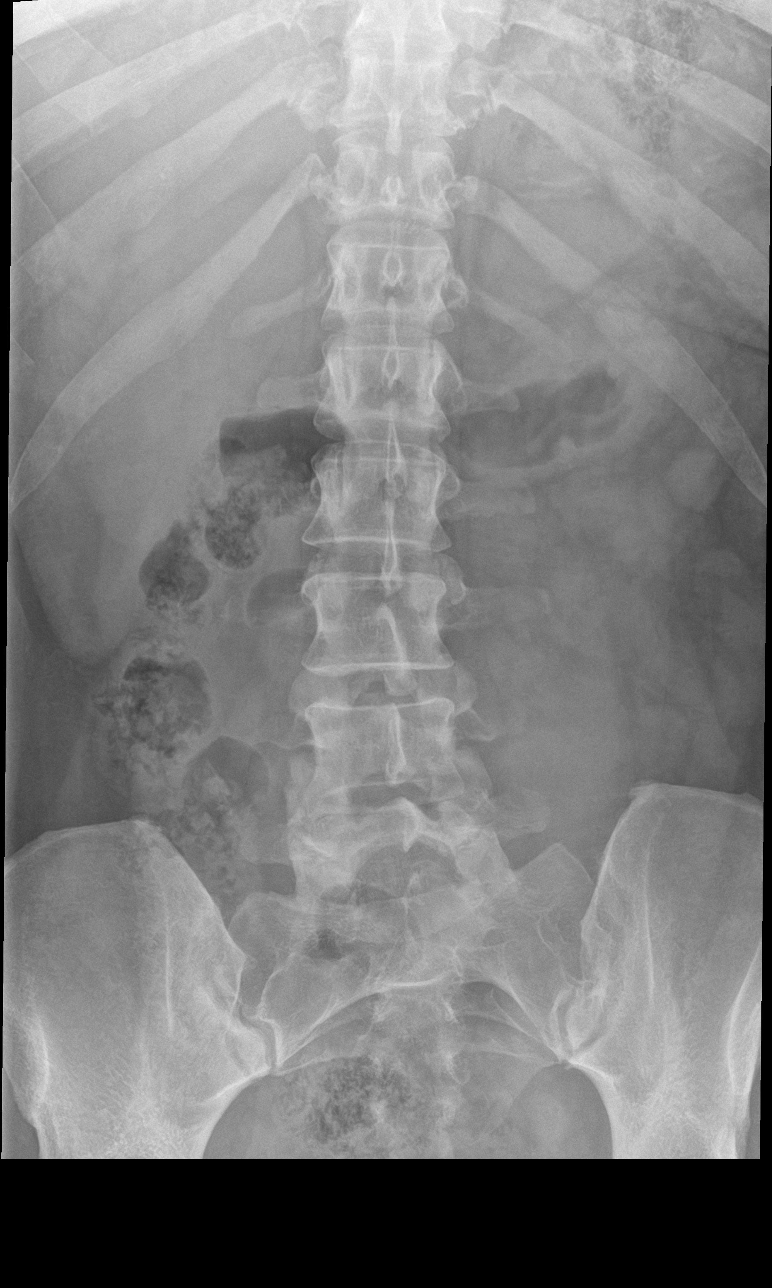

[l-spine obl (1 of 2)]
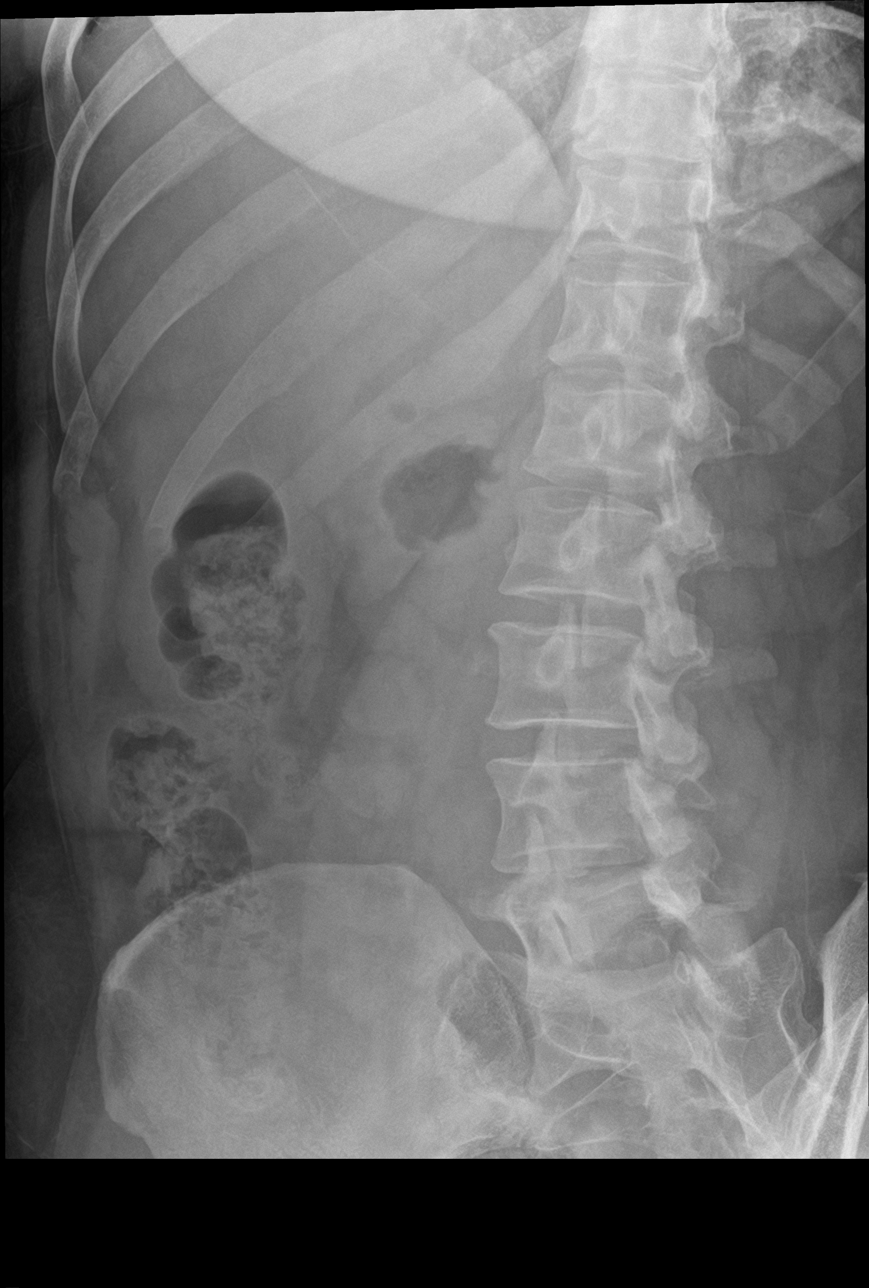

[l-spine obl (2 of 2)]
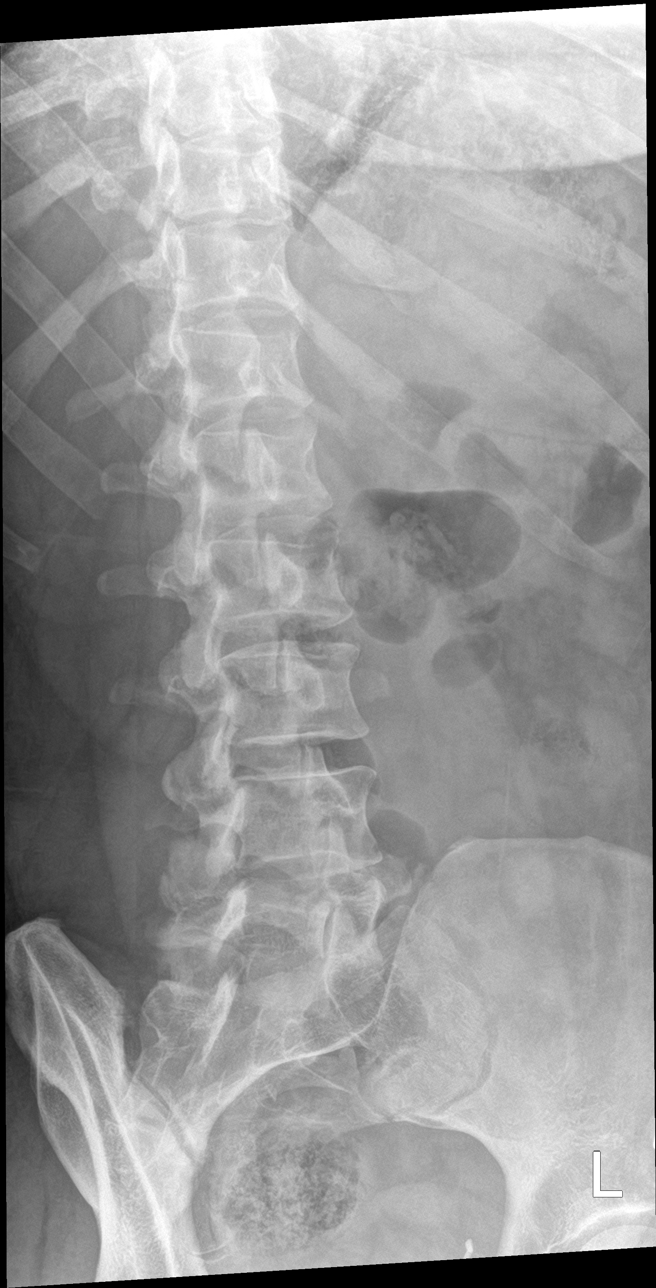

[l-spine lat]
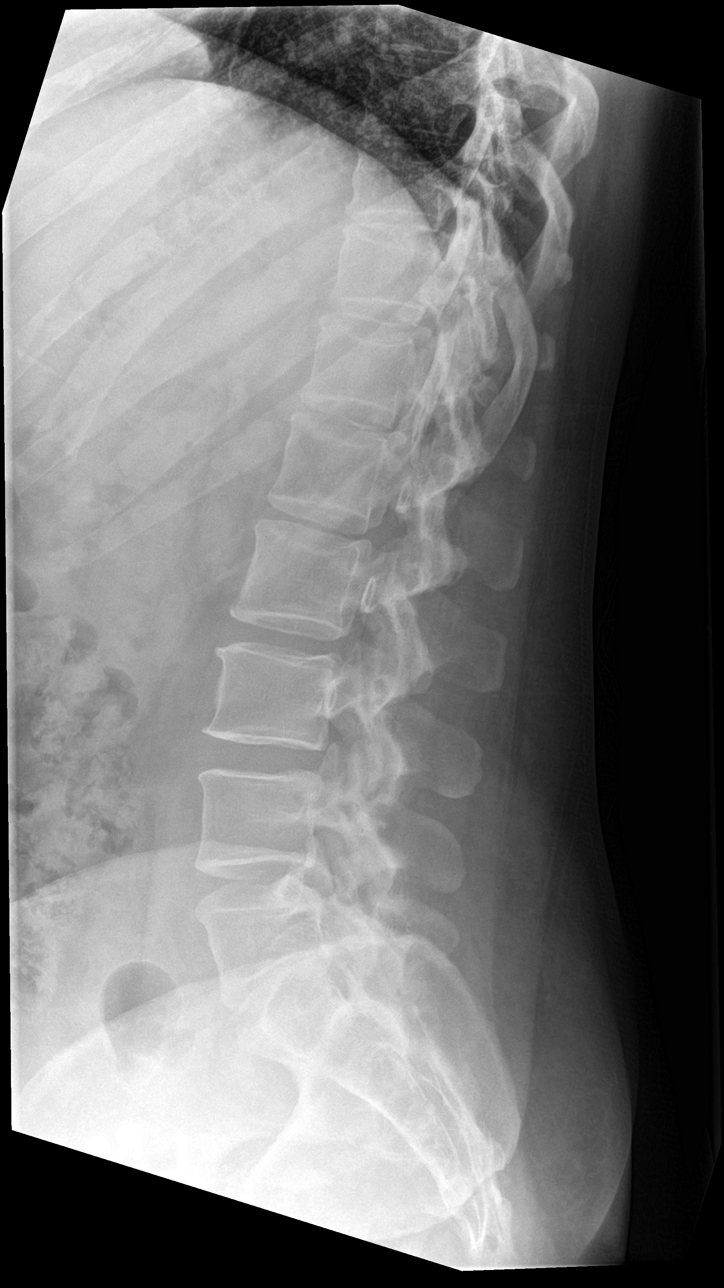

[l-spine spot]
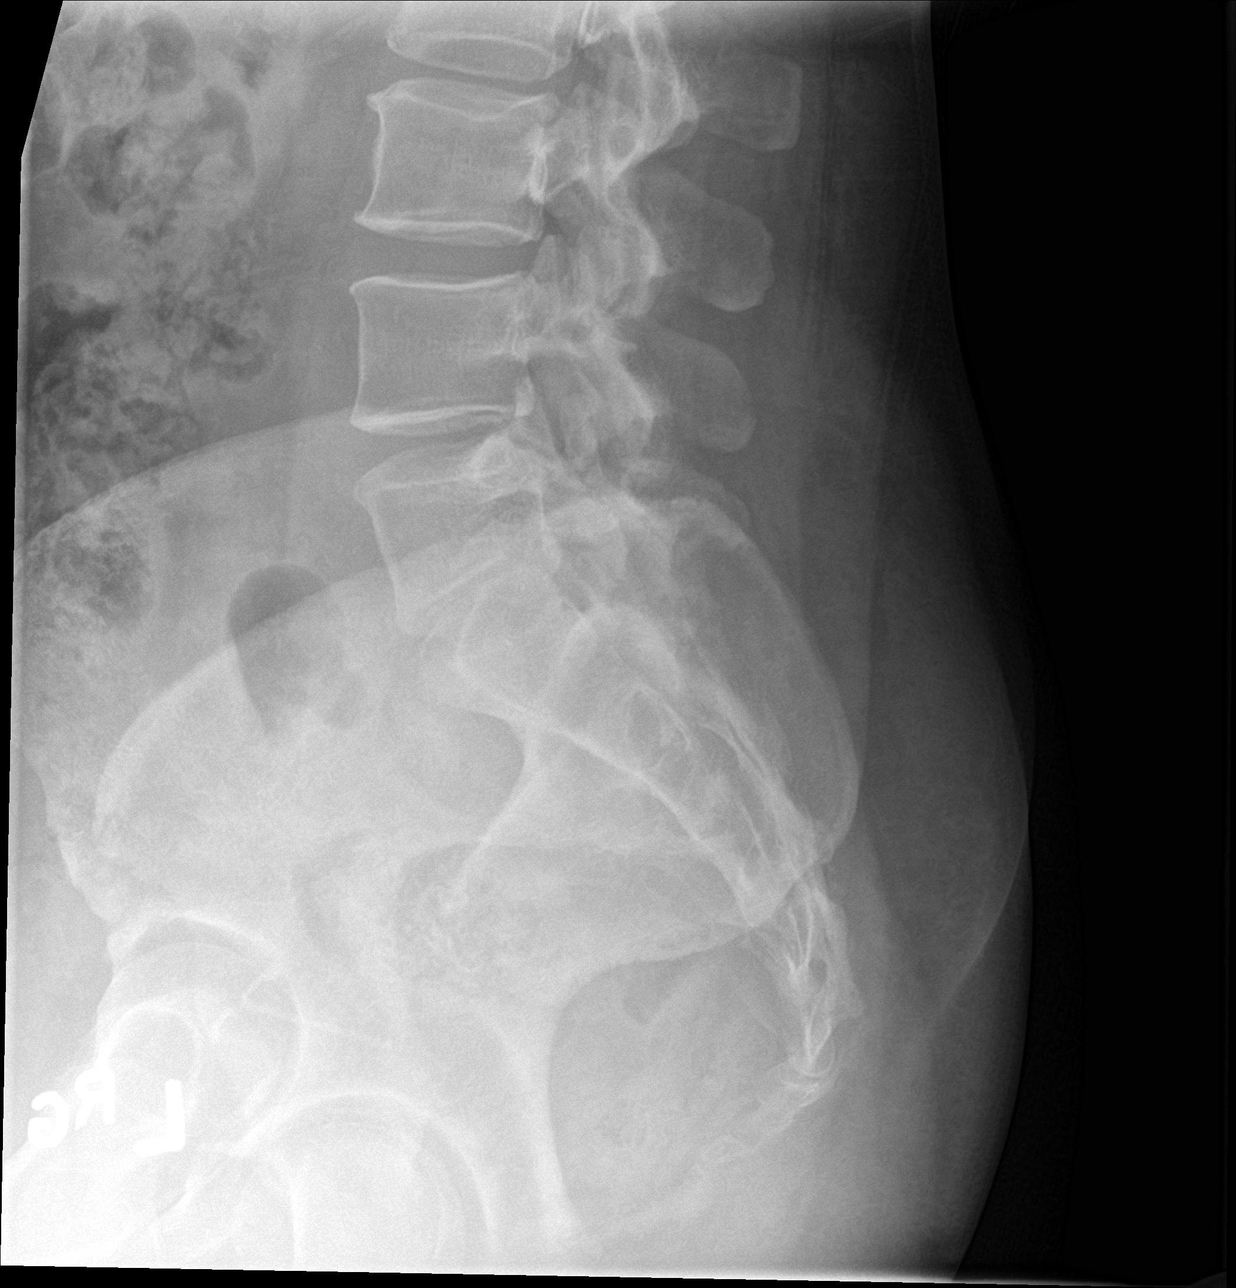

[5 of 5 positions shown; findings below may reference images not displayed]

FINDINGS: There are 5 nonrib bearing lumbar-type vertebral bodies.

The vertebral body heights are maintained.

Mild dextrocurvature of the lumbar spine. There is no static
listhesis. There is no spondylolysis.

There is no acute fracture.

Degenerative disc disease with disc height loss at L4-5 and L5-S1.

The SI joints are unremarkable.
IMPRESSION: No acute osseous injury of the lumbar spine.

## 2019-04-15 DIAGNOSIS — Z03818 Encounter for observation for suspected exposure to other biological agents ruled out: Secondary | ICD-10-CM | POA: Diagnosis not present

## 2019-06-24 DIAGNOSIS — Z01419 Encounter for gynecological examination (general) (routine) without abnormal findings: Secondary | ICD-10-CM | POA: Diagnosis not present

## 2019-06-24 DIAGNOSIS — F419 Anxiety disorder, unspecified: Secondary | ICD-10-CM | POA: Diagnosis not present

## 2019-06-24 DIAGNOSIS — A609 Anogenital herpesviral infection, unspecified: Secondary | ICD-10-CM | POA: Diagnosis not present

## 2019-06-24 DIAGNOSIS — Z01411 Encounter for gynecological examination (general) (routine) with abnormal findings: Secondary | ICD-10-CM | POA: Diagnosis not present

## 2019-06-24 DIAGNOSIS — N920 Excessive and frequent menstruation with regular cycle: Secondary | ICD-10-CM | POA: Diagnosis not present

## 2019-07-17 DIAGNOSIS — K219 Gastro-esophageal reflux disease without esophagitis: Secondary | ICD-10-CM | POA: Diagnosis not present

## 2019-07-17 DIAGNOSIS — Z30433 Encounter for removal and reinsertion of intrauterine contraceptive device: Secondary | ICD-10-CM | POA: Diagnosis not present

## 2019-08-09 ENCOUNTER — Emergency Department (HOSPITAL_BASED_OUTPATIENT_CLINIC_OR_DEPARTMENT_OTHER)
Admission: EM | Admit: 2019-08-09 | Discharge: 2019-08-09 | Disposition: A | Discharge status: Home / Self Care | Payer: BC Managed Care – PPO | Attending: Emergency Medicine | Admitting: Emergency Medicine

## 2019-08-09 ENCOUNTER — Encounter (HOSPITAL_BASED_OUTPATIENT_CLINIC_OR_DEPARTMENT_OTHER): Payer: Self-pay | Admitting: Emergency Medicine

## 2019-08-09 ENCOUNTER — Other Ambulatory Visit: Payer: Self-pay

## 2019-08-09 DIAGNOSIS — Z79899 Other long term (current) drug therapy: Secondary | ICD-10-CM | POA: Insufficient documentation

## 2019-08-09 DIAGNOSIS — Z87891 Personal history of nicotine dependence: Secondary | ICD-10-CM | POA: Insufficient documentation

## 2019-08-09 DIAGNOSIS — K6289 Other specified diseases of anus and rectum: Secondary | ICD-10-CM | POA: Diagnosis not present

## 2019-08-09 DIAGNOSIS — K649 Unspecified hemorrhoids: Secondary | ICD-10-CM

## 2019-08-09 MED ORDER — HYDROCORTISONE (PERIANAL) 2.5 % EX CREA: 1.0000 "application " | TOPICAL_CREAM | Freq: Two times a day (BID) | CUTANEOUS | 0 refills | Status: AC

## 2019-08-09 MED ORDER — LIDOCAINE HCL URETHRAL/MUCOSAL 2 % EX GEL
1.0000 "application " | Freq: Once | CUTANEOUS | Status: AC
  Administered 2019-08-09: 1 via TOPICAL
  Filled 2019-08-09: qty 20

## 2019-08-09 MED ORDER — DOCUSATE SODIUM 100 MG PO CAPS: 100.0000 mg | ORAL_CAPSULE | Freq: Two times a day (BID) | ORAL | 0 refills | Status: AC

## 2019-08-09 NOTE — ED Triage Notes (Signed)
Reports rectal pain that started 2 weeks ago.  PCP sent script for suppositories but reports no relief in symptoms.  Unsure if it is hemorrhoids or not.

## 2019-08-09 NOTE — ED Provider Notes (Signed)
[facility] EMERGENCY DEPARTMENT Provider Note   CSN: [id number] Arrival date & time: 08/09/19  1854     History Chief Complaint  Patient presents with  . Rectal Pain    Lisa Gibbs is a 44 y.o. female.  Patient is a 44 year old female who presents with rectal pain.  She said it started about a week and 1/2 to 2 weeks ago.  She says it is one spot in her rectum that feels a little swollen and hurts when she has a bowel movement.  She says is not really hurting when she is not having a bowel movement other than if she sits directly on it.  She does have a history of constipation at times and takes a natural stool softener.  She denies any fevers.  No abdominal pain.  No nausea or vomiting.  No known history of hemorrhoids.  She had a telehealth visit and was given a prescription for hemorrhoid suppositories.  She states she has been using these without improvement in symptoms.        Past Medical History:  Diagnosis Date  . GERD (gastroesophageal reflux disease)   . Hx of seasonal allergies     Patient Active Problem List   Diagnosis Date Noted  . Low back pain 07/19/2017  . Ganglion cyst of right foot 07/04/2017  . Seasonal allergies 06/11/2017  . Gastroesophageal reflux disease without esophagitis 06/11/2017    History reviewed. No pertinent surgical history.   OB History   No obstetric history on file.     No family history on file.  Social History   Tobacco Use  . Smoking status: Former Smoker    Packs/day: 0.00    Types: Cigars  . Smokeless tobacco: Never Used  Substance Use Topics  . Alcohol use: Yes    Comment: occassional  . Drug use: No    Home Medications Prior to Admission medications   Medication Sig Start Date End Date Taking? Authorizing Provider  cyclobenzaprine (FLEXERIL) 5 MG tablet Take 1 tablet (5 mg total) by mouth 3 (three) times daily as needed for muscle spasms. 07/12/17   Drenda Freeze, MD  diclofenac sodium  (VOLTAREN) 1 % GEL Apply 4 g topically 4 (four) times daily. 07/12/17   Drenda Freeze, MD  docusate sodium (COLACE) 100 MG capsule Take 1 capsule (100 mg total) by mouth every 12 (twelve) hours. 08/09/19   Malvin Johns, MD  esomeprazole (NEXIUM) 20 MG capsule Take 20 mg by mouth daily.    [provider]  famotidine (PEPCID) 20 MG tablet Take 1 tablet (20 mg total) by mouth 2 (two) times daily. 11/17/14   Carlisle Cater, PA-C  HYDROcodone-acetaminophen (NORCO/VICODIN) 5-325 MG tablet Take 1 tablet by mouth every 6 (six) hours as needed. 07/12/17   Drenda Freeze, MD  hydrocortisone (ANUSOL-HC) 2.5 % rectal cream Place 1 application rectally 2 (two) times daily. 08/09/19   Malvin Johns, MD  loratadine-pseudoephedrine (CLARITIN-D 12-HOUR) 5-120 MG per tablet Take 1 tablet by mouth 2 (two) times daily.    [provider]  meloxicam (MOBIC) 15 MG tablet Take 1 tablet (15 mg total) by mouth daily. 07/31/17   Dene Gentry, MD  methylPREDNISolone (MEDROL DOSEPAK) 4 MG TBPK tablet As directed by package insert 11/15/14   Pisciotta, Elmyra Ricks, PA-C  omeprazole (PRILOSEC) 20 MG capsule Take 20 mg by mouth daily.    [provider]  predniSONE (DELTASONE) 20 MG tablet Take 60 mg daily x 3 days then  40 mg daily x 3 days then 20 mg daily x 3 days 07/12/17   Charlynne Pander, MD  sucralfate (CARAFATE) 1 g tablet Take 1 tablet (1 g total) by mouth 4 (four) times daily -  with meals and at bedtime. 04/03/16   Lawyer, Cristal Deer, PA-C  traMADol (ULTRAM) 50 MG tablet Take 1 tablet (50 mg total) by mouth every 6 (six) hours as needed for severe pain. 04/03/16   Lawyer, Cristal Deer, PA-C  UNKNOWN TO PATIENT     [provider]    Allergies    Aspirin and Tolmetin  Review of Systems   Review of Systems  Constitutional: Negative for chills, diaphoresis, fatigue and fever.  HENT: Negative for congestion, rhinorrhea and sneezing.   Eyes: Negative.   Respiratory: Negative for  cough, chest tightness and shortness of breath.   Cardiovascular: Negative for chest pain and leg swelling.  Gastrointestinal: Positive for rectal pain. Negative for abdominal pain, blood in stool, diarrhea, nausea and vomiting.  Genitourinary: Negative for difficulty urinating, flank pain, frequency and hematuria.  Musculoskeletal: Negative for arthralgias and back pain.  Skin: Negative for rash.  Neurological: Negative for dizziness, speech difficulty, weakness, numbness and headaches.    Physical Exam Updated Vital Signs BP 134/88 (BP Location: Right Arm)   Pulse 90   Temp 97.9 F (36.6 C) (Oral)   Resp 16   Ht 5\' 4"  (1.626 m)   Wt 85.3 kg   SpO2 100%   BMI 32.27 kg/m   Physical Exam Constitutional:      Appearance: She is well-developed.  HENT:     Head: Normocephalic and atraumatic.  Eyes:     Pupils: Pupils are equal, round, and reactive to light.  Cardiovascular:     Rate and Rhythm: Normal rate and regular rhythm.     Heart sounds: Normal heart sounds.  Pulmonary:     Effort: Pulmonary effort is normal. No respiratory distress.     Breath sounds: Normal breath sounds. No wheezing or rales.  Chest:     Chest wall: No tenderness.  Abdominal:     General: Bowel sounds are normal.     Palpations: Abdomen is soft.     Tenderness: There is no abdominal tenderness. There is no guarding or rebound.  Genitourinary:    Comments: Patient has a moderate sized nonthrombosed hemorrhoid that extends just inside the anus.  It is tender.  There is no bleeding.  I do not appreciate any suggestions of a peritonsillar abscess.  No swelling or tenderness to the internal region.  I do not appreciate any obvious fissure. Musculoskeletal:        General: Normal range of motion.     Cervical back: Normal range of motion and neck supple.  Lymphadenopathy:     Cervical: No cervical adenopathy.  Skin:    General: Skin is warm and dry.     Findings: No rash.  Neurological:     Mental  Status: She is alert and oriented to person, place, and time.     ED Results / Procedures / Treatments   Labs (all labs ordered are listed, but only abnormal results are displayed) Labs Reviewed - No data to display  EKG None  Radiology No results found.  Procedures Procedures (including critical care time)  Medications Ordered in ED Medications  lidocaine (XYLOCAINE) 2 % jelly 1 application (has no administration in time range)    ED Course  I have reviewed the triage vital signs and  the nursing notes.  Pertinent labs & imaging results that were available during my care of the patient were reviewed by me and considered in my medical decision making (see chart for details).    MDM Rules/Calculators/A&P                      Patient has a tender area in her rectum that appears to be consistent with a hemorrhoid.  I do not appreciate any evidence of a perirectal abscess.  There is no bleeding.  Patient will continue using the hemorrhoid suppositories.  I also gave her prescription for the cream.  She was given some lidocaine jelly to use for symptomatic relief.  She was also given a prescription for Colace.  I was getting get her a referral to gastroenterology but she goes to [facility] and will follow up with her PCP in the next 1 to 2 days for referral to their GI if needed.  She was advised to return if she has any worsening pain, fevers, abdominal pain, vomiting or other worsening symptoms. Final Clinical Impression(s) / ED Diagnoses Final diagnoses:  Hemorrhoids, unspecified hemorrhoid type    Rx / DC Orders ED Discharge Orders         Ordered    hydrocortisone (ANUSOL-HC) 2.5 % rectal cream  2 times daily     08/09/19 1926    docusate sodium (COLACE) 100 MG capsule  Every 12 hours     08/09/19 1926           Rolan Bucco, MD 08/09/19 2047

## 2019-08-11 DIAGNOSIS — Z86010 Personal history of colonic polyps: Secondary | ICD-10-CM | POA: Diagnosis not present

## 2019-08-11 DIAGNOSIS — R109 Unspecified abdominal pain: Secondary | ICD-10-CM | POA: Diagnosis not present

## 2019-08-11 DIAGNOSIS — K6289 Other specified diseases of anus and rectum: Secondary | ICD-10-CM | POA: Diagnosis not present

## 2019-08-11 DIAGNOSIS — K219 Gastro-esophageal reflux disease without esophagitis: Secondary | ICD-10-CM | POA: Diagnosis not present

## 2019-08-28 DIAGNOSIS — K635 Polyp of colon: Secondary | ICD-10-CM | POA: Diagnosis not present

## 2019-08-28 DIAGNOSIS — Z86010 Personal history of colonic polyps: Secondary | ICD-10-CM | POA: Diagnosis not present

## 2019-08-28 DIAGNOSIS — Z1211 Encounter for screening for malignant neoplasm of colon: Secondary | ICD-10-CM | POA: Diagnosis not present

## 2019-09-03 DIAGNOSIS — R109 Unspecified abdominal pain: Secondary | ICD-10-CM | POA: Diagnosis not present

## 2019-09-03 DIAGNOSIS — K635 Polyp of colon: Secondary | ICD-10-CM | POA: Diagnosis not present

## 2019-09-08 DIAGNOSIS — K219 Gastro-esophageal reflux disease without esophagitis: Secondary | ICD-10-CM | POA: Diagnosis not present

## 2019-09-08 DIAGNOSIS — Z1211 Encounter for screening for malignant neoplasm of colon: Secondary | ICD-10-CM | POA: Diagnosis not present

## 2019-09-08 DIAGNOSIS — R1310 Dysphagia, unspecified: Secondary | ICD-10-CM | POA: Diagnosis not present

## 2019-09-08 DIAGNOSIS — Z01818 Encounter for other preprocedural examination: Secondary | ICD-10-CM | POA: Diagnosis not present

## 2019-09-16 DIAGNOSIS — K2270 Barrett's esophagus without dysplasia: Secondary | ICD-10-CM | POA: Diagnosis not present

## 2019-09-16 DIAGNOSIS — K900 Celiac disease: Secondary | ICD-10-CM | POA: Diagnosis not present

## 2019-09-16 DIAGNOSIS — K2970 Gastritis, unspecified, without bleeding: Secondary | ICD-10-CM | POA: Diagnosis not present

## 2019-09-16 DIAGNOSIS — A048 Other specified bacterial intestinal infections: Secondary | ICD-10-CM | POA: Diagnosis not present

## 2019-09-17 DIAGNOSIS — K200 Eosinophilic esophagitis: Secondary | ICD-10-CM | POA: Diagnosis not present

## 2020-01-12 DIAGNOSIS — Z03818 Encounter for observation for suspected exposure to other biological agents ruled out: Secondary | ICD-10-CM | POA: Diagnosis not present

## 2020-01-13 DIAGNOSIS — Z03818 Encounter for observation for suspected exposure to other biological agents ruled out: Secondary | ICD-10-CM | POA: Diagnosis not present

## 2020-03-24 DIAGNOSIS — R0989 Other specified symptoms and signs involving the circulatory and respiratory systems: Secondary | ICD-10-CM | POA: Diagnosis not present

## 2020-03-24 DIAGNOSIS — Z20822 Contact with and (suspected) exposure to covid-19: Secondary | ICD-10-CM | POA: Diagnosis not present

## 2020-03-24 DIAGNOSIS — R0600 Dyspnea, unspecified: Secondary | ICD-10-CM | POA: Diagnosis not present

## 2020-03-24 DIAGNOSIS — F17210 Nicotine dependence, cigarettes, uncomplicated: Secondary | ICD-10-CM | POA: Diagnosis not present

## 2020-05-05 DIAGNOSIS — Z03818 Encounter for observation for suspected exposure to other biological agents ruled out: Secondary | ICD-10-CM | POA: Diagnosis not present
